# Patient Record
Sex: Male | Born: 1980 | Race: Black or African American | Hispanic: No | Marital: Single | Smoking: Never smoker
Health system: Southern US, Community
[De-identification: ages and names within clinical notes are randomized; demographics above are authoritative.]

## PROBLEM LIST (undated history)

## (undated) DIAGNOSIS — I10 Essential (primary) hypertension: Secondary | ICD-10-CM

---

## 2020-01-19 ENCOUNTER — Emergency Department (HOSPITAL_BASED_OUTPATIENT_CLINIC_OR_DEPARTMENT_OTHER): Payer: Self-pay

## 2020-01-19 ENCOUNTER — Other Ambulatory Visit: Payer: Self-pay

## 2020-01-19 ENCOUNTER — Encounter (HOSPITAL_BASED_OUTPATIENT_CLINIC_OR_DEPARTMENT_OTHER): Payer: Self-pay | Admitting: Emergency Medicine

## 2020-01-19 ENCOUNTER — Emergency Department (HOSPITAL_BASED_OUTPATIENT_CLINIC_OR_DEPARTMENT_OTHER)
Admission: EM | Admit: 2020-01-19 | Discharge: 2020-01-19 | Disposition: A | Discharge status: Home / Self Care | Payer: Self-pay | Attending: Emergency Medicine | Admitting: Emergency Medicine

## 2020-01-19 DIAGNOSIS — Y999 Unspecified external cause status: Secondary | ICD-10-CM | POA: Insufficient documentation

## 2020-01-19 DIAGNOSIS — Y939 Activity, unspecified: Secondary | ICD-10-CM | POA: Insufficient documentation

## 2020-01-19 DIAGNOSIS — S0240FB Zygomatic fracture, left side, initial encounter for open fracture: Secondary | ICD-10-CM

## 2020-01-19 DIAGNOSIS — Y929 Unspecified place or not applicable: Secondary | ICD-10-CM | POA: Insufficient documentation

## 2020-01-19 DIAGNOSIS — I10 Essential (primary) hypertension: Secondary | ICD-10-CM | POA: Insufficient documentation

## 2020-01-19 DIAGNOSIS — S02402B Zygomatic fracture, unspecified, initial encounter for open fracture: Secondary | ICD-10-CM | POA: Insufficient documentation

## 2020-01-19 DIAGNOSIS — S0990XA Unspecified injury of head, initial encounter: Secondary | ICD-10-CM | POA: Insufficient documentation

## 2020-01-19 DIAGNOSIS — S0083XA Contusion of other part of head, initial encounter: Secondary | ICD-10-CM | POA: Insufficient documentation

## 2020-01-19 HISTORY — DX: Essential (primary) hypertension: I10

## 2020-01-19 MED ORDER — TETANUS-DIPHTH-ACELL PERTUSSIS 5-2.5-18.5 LF-MCG/0.5 IM SUSP
0.5000 mL | Freq: Once | INTRAMUSCULAR | Status: AC
  Administered 2020-01-19: 0.5 mL via INTRAMUSCULAR
  Filled 2020-01-19: qty 0.5

## 2020-01-19 MED ORDER — LIDOCAINE-EPINEPHRINE-TETRACAINE (LET) TOPICAL GEL
3.0000 mL | Freq: Once | TOPICAL | Status: AC
  Administered 2020-01-19: 3 mL via TOPICAL
  Filled 2020-01-19: qty 3

## 2020-01-19 MED ORDER — CEPHALEXIN 500 MG PO CAPS: 500.0000 mg | ORAL_CAPSULE | Freq: Two times a day (BID) | ORAL | 0 refills | Status: AC

## 2020-01-19 MED ORDER — HYDROCODONE-ACETAMINOPHEN 5-325 MG PO TABS: 1.0000 | ORAL_TABLET | Freq: Four times a day (QID) | ORAL | 0 refills | Status: AC | PRN

## 2020-01-19 MED ORDER — LIDOCAINE-EPINEPHRINE (PF) 2 %-1:200000 IJ SOLN
10.0000 mL | Freq: Once | INTRAMUSCULAR | Status: AC
  Administered 2020-01-19: 10 mL
  Filled 2020-01-19: qty 10

## 2020-01-19 MED ORDER — HYDROCODONE-ACETAMINOPHEN 5-325 MG PO TABS
1.0000 | ORAL_TABLET | Freq: Once | ORAL | Status: AC
  Administered 2020-01-19: 1 via ORAL
  Filled 2020-01-19: qty 1
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
